# Patient Record
Sex: Male | Born: 1980
Health system: Southern US, Community
[De-identification: ages and names within clinical notes are randomized; demographics above are authoritative.]

## PROBLEM LIST (undated history)

## (undated) HISTORY — PX: KNEE SURGERY: SHX244

---
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
## 2002-10-20 ENCOUNTER — Encounter: Payer: Self-pay | Admitting: *Deleted

## 2002-10-20 ENCOUNTER — Emergency Department (HOSPITAL_COMMUNITY): Admission: EM | Admit: 2002-10-20 | Discharge: 2002-10-20 | Payer: Self-pay | Admitting: Emergency Medicine

## 2002-10-31 ENCOUNTER — Emergency Department (HOSPITAL_COMMUNITY): Admission: EM | Admit: 2002-10-31 | Discharge: 2002-10-31 | Payer: Self-pay | Admitting: Emergency Medicine

## 2002-11-19 ENCOUNTER — Emergency Department (HOSPITAL_COMMUNITY): Admission: EM | Admit: 2002-11-19 | Discharge: 2002-11-20 | Payer: Self-pay | Admitting: Emergency Medicine

## 2007-10-21 ENCOUNTER — Emergency Department (HOSPITAL_COMMUNITY): Admission: EM | Admit: 2007-10-21 | Discharge: 2007-10-22 | Payer: Self-pay | Admitting: Emergency Medicine

## 2009-10-07 ENCOUNTER — Inpatient Hospital Stay (HOSPITAL_COMMUNITY): Admission: EM | Admit: 2009-10-07 | Discharge: 2009-10-08 | Payer: Self-pay | Admitting: Emergency Medicine

## 2010-09-03 LAB — COMPREHENSIVE METABOLIC PANEL
Alkaline Phosphatase: 66 U/L (ref 39–117)
BUN: 13 mg/dL (ref 6–23)
BUN: 16 mg/dL (ref 6–23)
CO2: 26 mEq/L (ref 19–32)
CO2: 27 mEq/L (ref 19–32)
Chloride: 104 mEq/L (ref 96–112)
Creatinine, Ser: 1.45 mg/dL (ref 0.4–1.5)
GFR calc non Af Amer: 54 mL/min — ABNORMAL LOW (ref >60)
GFR calc non Af Amer: 58 mL/min — ABNORMAL LOW (ref >60)
Glucose, Bld: 141 mg/dL — ABNORMAL HIGH (ref 70–99)
Potassium: 3.7 mEq/L (ref 3.5–5.1)
Total Bilirubin: 0.5 mg/dL (ref 0.3–1.2)
Total Bilirubin: 0.5 mg/dL (ref 0.3–1.2)
Total Protein: 6.4 g/dL (ref 6.0–8.3)

## 2010-09-03 LAB — CBC
HCT: 44.1 % (ref 39.0–52.0)
MCHC: 33.3 g/dL (ref 30.0–36.0)
MCV: 90.6 fL (ref 78.0–100.0)
MCV: 90.9 fL (ref 78.0–100.0)
Platelets: 228 10*3/uL (ref 150–400)
Platelets: 239 10*3/uL (ref 150–400)
RBC: 4.87 MIL/uL (ref 4.22–5.81)
RDW: 13.3 % (ref 11.5–15.5)
WBC: 4.1 10*3/uL (ref 4.0–10.5)

## 2010-09-03 LAB — MAGNESIUM: Magnesium: 2.3 mg/dL (ref 1.5–2.5)

## 2010-09-03 LAB — RAPID URINE DRUG SCREEN, HOSP PERFORMED
Amphetamines: NOT DETECTED
Barbiturates: NOT DETECTED
Tetrahydrocannabinol: NOT DETECTED

## 2010-09-03 LAB — DIFFERENTIAL
Basophils Absolute: 0 10*3/uL (ref 0.0–0.1)
Lymphocytes Relative: 42 % (ref 12–46)
Neutro Abs: 1.9 10*3/uL (ref 1.7–7.7)
Neutrophils Relative %: 48 % (ref 43–77)

## 2010-09-03 LAB — BASIC METABOLIC PANEL
BUN: 13 mg/dL (ref 6–23)
CO2: 27 mEq/L (ref 19–32)
Calcium: 8.5 mg/dL (ref 8.4–10.5)
Chloride: 109 mEq/L (ref 96–112)
Creatinine, Ser: 1.4 mg/dL (ref 0.4–1.5)
GFR calc Af Amer: >60 mL/min (ref >60)
Glucose, Bld: 97 mg/dL (ref 70–99)

## 2010-09-03 LAB — PROTEIN, CSF: Total  Protein, CSF: 27 mg/dL (ref 15–45)

## 2010-09-03 LAB — CSF CELL COUNT WITH DIFFERENTIAL
RBC Count, CSF: 1 /mm3 — ABNORMAL HIGH
Tube #: 1
WBC, CSF: 0 /mm3 (ref 0–5)

## 2010-09-03 LAB — URINALYSIS, ROUTINE W REFLEX MICROSCOPIC
Nitrite: NEGATIVE
Protein, ur: NEGATIVE mg/dL
Specific Gravity, Urine: 1.011 (ref 1.005–1.030)
Urobilinogen, UA: 1 mg/dL (ref 0.0–1.0)

## 2010-09-03 LAB — CSF CULTURE W GRAM STAIN
Culture: NO GROWTH
Gram Stain: NONE SEEN

## 2010-09-03 LAB — SEDIMENTATION RATE: Sed Rate: 12 mm/hr (ref 0–16)

## 2010-09-03 LAB — SALICYLATE LEVEL: Salicylate Lvl: <4 mg/dL (ref 2.8–20.0)

## 2010-09-03 LAB — HIV ANTIBODY (ROUTINE TESTING W REFLEX): HIV: NONREACTIVE

## 2010-09-03 LAB — VITAMIN B12: Vitamin B-12: 454 pg/mL (ref 211–911)

## 2014-10-05 ENCOUNTER — Encounter (HOSPITAL_COMMUNITY): Payer: Self-pay | Admitting: Emergency Medicine

## 2014-10-05 ENCOUNTER — Emergency Department (HOSPITAL_COMMUNITY)
Admission: EM | Admit: 2014-10-05 | Discharge: 2014-10-06 | Disposition: A | Discharge status: Home / Self Care | Payer: No Typology Code available for payment source | Attending: Emergency Medicine | Admitting: Emergency Medicine

## 2014-10-05 DIAGNOSIS — S3992XA Unspecified injury of lower back, initial encounter: Secondary | ICD-10-CM | POA: Diagnosis present

## 2014-10-05 DIAGNOSIS — Y92410 Unspecified street and highway as the place of occurrence of the external cause: Secondary | ICD-10-CM | POA: Insufficient documentation

## 2014-10-05 DIAGNOSIS — M545 Low back pain, unspecified: Secondary | ICD-10-CM

## 2014-10-05 DIAGNOSIS — S4991XA Unspecified injury of right shoulder and upper arm, initial encounter: Secondary | ICD-10-CM | POA: Diagnosis not present

## 2014-10-05 DIAGNOSIS — Y9389 Activity, other specified: Secondary | ICD-10-CM | POA: Diagnosis not present

## 2014-10-05 DIAGNOSIS — S6992XA Unspecified injury of left wrist, hand and finger(s), initial encounter: Secondary | ICD-10-CM | POA: Diagnosis not present

## 2014-10-05 DIAGNOSIS — Y998 Other external cause status: Secondary | ICD-10-CM | POA: Insufficient documentation

## 2014-10-05 DIAGNOSIS — G8929 Other chronic pain: Secondary | ICD-10-CM | POA: Insufficient documentation

## 2014-10-05 DIAGNOSIS — M25532 Pain in left wrist: Secondary | ICD-10-CM

## 2014-10-05 DIAGNOSIS — M25511 Pain in right shoulder: Secondary | ICD-10-CM
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
## 2014-10-06 ENCOUNTER — Emergency Department (HOSPITAL_COMMUNITY): Payer: No Typology Code available for payment source

## 2014-10-06 MED ORDER — METHOCARBAMOL 500 MG PO TABS
500.0000 mg | ORAL_TABLET | Freq: Once | ORAL | Status: AC
  Administered 2014-10-06: 500 mg via ORAL
  Filled 2014-10-06: qty 1

## 2014-10-06 MED ORDER — NAPROXEN 500 MG PO TABS: 500.0000 mg | ORAL_TABLET | Freq: Two times a day (BID) | ORAL | Status: DC

## 2014-10-06 MED ORDER — IBUPROFEN 400 MG PO TABS
800.0000 mg | ORAL_TABLET | Freq: Once | ORAL | Status: AC
  Administered 2014-10-06: 800 mg via ORAL
  Filled 2014-10-06: qty 2

## 2014-10-06 MED ORDER — METHOCARBAMOL 500 MG PO TABS: 500.0000 mg | ORAL_TABLET | Freq: Two times a day (BID) | ORAL | Status: DC
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
## 2014-10-06 NOTE — Discharge Instructions (Signed)
1. Medications: robaxin, naproxyn, usual home medications °2. Treatment: rest, drink plenty of fluids, gentle stretching as discussed, alternate ice and heat °3. Follow Up: Please followup with your primary doctor in 3 days for discussion of your diagnoses and further evaluation after today's visit; if you do not have a primary care doctor use the resource guide provided to find one;  Return to the ER for worsening back pain, difficulty walking, loss of bowel or bladder control or other concerning symptoms ° ° °Back Exercises °Back exercises help treat and prevent back injuries. The goal of back exercises is to increase the strength of your abdominal and back muscles and the flexibility of your back. These exercises should be started when you no longer have back pain. Back exercises include: °· Pelvic Tilt. Lie on your back with your knees bent. Tilt your pelvis until the lower part of your back is against the floor. Hold this position 5 to 10 sec and repeat 5 to 10 times. °· Knee to Chest. Pull first 1 knee up against your chest and hold for 20 to 30 seconds, repeat this with the other knee, and then both knees. This may be done with the other leg straight or bent, whichever feels better. °· Sit-Ups or Curl-Ups. Bend your knees 90 degrees. Start with tilting your pelvis, and do a partial, slow sit-up, lifting your trunk only 30 to 45 degrees off the floor. Take at least 2 to 3 seconds for each sit-up. Do not do sit-ups with your knees out straight. If partial sit-ups are difficult, simply do the above but with only tightening your abdominal muscles and holding it as directed. °· Hip-Lift. Lie on your back with your knees flexed 90 degrees. Push down with your feet and shoulders as you raise your hips a couple inches off the floor; hold for 10 seconds, repeat 5 to 10 times. °· Back arches. Lie on your stomach, propping yourself up on bent elbows. Slowly press on your hands, causing an arch in your low back. Repeat 3  to 5 times. Any initial stiffness and discomfort should lessen with repetition over time. °· Shoulder-Lifts. Lie face down with arms beside your body. Keep hips and torso pressed to floor as you slowly lift your head and shoulders off the floor. °Do not overdo your exercises, especially in the beginning. Exercises may cause you some mild back discomfort which lasts for a few minutes; however, if the pain is more severe, or lasts for more than 15 minutes, do not continue exercises until you see your caregiver. Improvement with exercise therapy for back problems is slow.  °See your caregivers for assistance with developing a proper back exercise program. °Document Released: 07/10/2004 Document Revised: 08/25/2011 Document Reviewed: 04/03/2011 °ExitCare® Patient Information ©2015 [employer]. This information is not intended to replace advice given to you by your health care provider. Make sure you discuss any questions you have with your health care provider. ° ° ° °Emergency Department Resource Guide °1) Find a Doctor and Pay Out of Pocket °Although you won't have to find out who is covered by your insurance plan, it is a good idea to ask around and get recommendations. You will then need to call the office and see if the doctor you have chosen will accept you as a new patient and what types of options they offer for patients who are self-pay. Some doctors offer discounts or will set up payment plans for their patients who do not have insurance, but   you will need to ask so you aren't surprised when you get to your appointment. ° °2) Contact Your Local Health Department °Not all health departments have doctors that can see patients for sick visits, but many do, so it is worth a call to see if yours does. If you don't know where your local health department is, you can check in your phone book. The CDC also has a tool to help you locate your state's health department, and many state websites also have listings of all  of their local health departments. ° °3) Find a Walk-in Clinic °If your illness is not likely to be very severe or complicated, you may want to try a walk in clinic. These are popping up all over the country in pharmacies, drugstores, and shopping centers. They're usually staffed by nurse practitioners or physician assistants that have been trained to treat common illnesses and complaints. They're usually fairly quick and inexpensive. However, if you have serious medical issues or chronic medical problems, these are probably not your best option. ° °No Primary Care Doctor: °- Call [facility] at  832-8000 - they can help you locate a primary care doctor that  accepts your insurance, provides certain services, etc. °- Physician Referral Service- 1-800-533-3463 ° °Chronic Pain Problems: °Organization         Address  Phone   Notes  °[facility]  (336) 297-2271 Patients need to be referred by their primary care doctor.  ° °Medication Assistance: °Organization         Address  Phone   Notes  °[place] [facility] [address] °[place], [place] [address] (336) 641-8030 --Must be a resident of [place] °-- Must have NO insurance coverage whatsoever (no Medicaid/ Medicare, etc.) °-- The pt. MUST have a primary care doctor that directs their care regularly and follows them in the community °  °[facility]  (866) 331-1348   °[employer]  (888) 892-1162   ° °Agencies that provide inexpensive medical care: °Organization         Address  Phone   Notes  °[facility]  (336) 832-8035   °[facility]    (336) 832-7272   °[facility] [address] °[place], [place] [address] (336) 832-4777   °[facility] [address], °[place] (336) 271-4999   °[facility]    (336) 373-0678   °[facility]    (336) 272-1050   °[facility] ° [address],  [place] Phone:  (336) 832-4444, Fax:  (336) 832-4440 Hours of Operation:  9 am - 6 pm, M-F.  Also accepts Medicaid/Medicare and self-pay.  °[facility] ° [address], [place] Phone: (336) 832-3150, Fax: (336) [phone]. Hours of Operation:  8:30 am - 5:30 pm, M-F.  Also accepts Medicaid and self-pay.  °[facility] [place] [address], [place] Phone: (336) 878-6027   °[facility] [address], [place], [place] (336)723-1848, Ext. 123 Mondays & Thursdays: 7-9 AM.  First 15 patients are seen on a first come, first serve basis. °  ° °Medicaid-accepting [place] Providers: ° °Organization         Address  Phone   Notes  °[facility] [address], [place] (336) 641-2100 Also accepts self-pay patients.  °[facility] [address], [place] ° (336) 856-9996   °[facility] [address], [place] (336) 288-8857   °[facility] [address] [place] [address], [place] (336) 299-7000   °Veita Bland [address], [place]  ° (336) 373-1557 Only accepts [place] Access Medicaid patients after they have their name applied to their card.  ° °Self-Pay (no insurance) in [place]: ° °Organization         Address  Phone   Notes  °Sickle Cell Patients, [facility] [address], [place] (336) 832-1970   °[facility] [address], [place] (336) 832-4400   °[facility] ° [address],  (336) 992-4800   °[facility]/Dr. Osei-Bonsu ° [address] [place] [address], [place] or [address], [place] (336) [phone] Phone number for both [place] and [place] locations is the same.  °[facility] [address], [place] (336) 299-0000   °[facility] [address] [place] [address], [place] or [address] (336) 852-7530 °(336) 878-2260   °[facility] [address], [place] (336) 350-1642, phone; (336) 294-5005, fax Sees patients 1st and 3rd Saturday of every month.  Must not qualify for public or private insurance (i.e. Medicaid, Medicare, [place] Health Choice, Veterans' Benefits) • Household income should be no more than 200% of the poverty level •The clinic cannot treat you if you are pregnant or think you are pregnant • Sexually transmitted diseases are not treated at the clinic.  ° ° °Dental Care: °Organization         Address  Phone  Notes  °[facility] [address], [place] (336) 641-6152 Accepts children up to age 21 who are enrolled in Medicaid or [place] Health Choice; pregnant women with a Medicaid card; and children who have applied for Medicaid or [place] Health Choice, but were declined, whose parents can pay a reduced fee at time of service.  °[facility] [place]  [address], [place] (336) 641-7733 Accepts children up to age 21 who are enrolled in Medicaid or [place] Health Choice; pregnant women with a Medicaid card; and children who have applied for Medicaid or [place] Health Choice, but were declined, whose parents can pay a reduced fee at time of service.  °[facility] ° [address], [place] (336) 641-4533 Patients are seen by appointment only. Walk-ins are not accepted. [facility] will see patients 18 years of age and older. °Monday - Tuesday (8am-5pm) °Most Wednesdays (8:30-5pm) °$30 per visit, cash only  °[facility] ° [address], [place] (336) 641-4533 Patients are seen by appointment only. Walk-ins are not accepted. [facility] will see patients 18 years of age and older. °One Wednesday Evening (Monthly: Volunteer Based).  $30 per visit, cash only  °[facility]   (919) 537-3737 for adults; Children under age 4, call Graduate Pediatric Dentistry at (919) [phone]. Children aged 4-14, please call (919) [phone] to request a pediatric application. ° Dental services are provided in all areas of dental care including fillings, crowns and bridges, complete and partial dentures, implants, gum treatment, root canals, and extractions. Preventive care is also provided. Treatment is provided to both adults and children. °Patients are selected via a lottery   and there is often a waiting list. °  °[facility] [address], °[place] ° (336) 763-8833 [email or web address] °  °[facility] [address], [place], [place] (336)723-1848, Ext. 123 Second and Fourth Thursday of each month, opens at 6:30 AM; Clinic ends at 9 AM.  Patients are seen on a first-come first-served basis, and a limited number are seen during each clinic.  ° °[facility] ° [address], [place], [place] (336) 723-7904   Eligibility Requirements °You must have lived in [place], or [place] counties for at least the last three months. °  You cannot be eligible for state or federal sponsored healthcare insurance, including Veterans Administration, Medicaid, or Medicare. °  You generally cannot be eligible for healthcare insurance through your employer.  °  How to apply: °Eligibility screenings are held every Tuesday and Wednesday afternoon from 1:00 pm until 4:00 pm. You do not need an appointment for the interview!  °[facility] [address], [place], [place] 336-631-2330   °[facility]  336-342-8273   °[facility]  336-703-3100   °[facility]  336-570-6415   ° °Behavioral Health Resources in the Community: °Intensive Outpatient Programs °Organization         Address  Phone  Notes  °[facility] [address], [place], [place] 336-878-6098   °[facility] [address], [place], [place] 336-832-9800   °[facility] [address], [place], [place] ° 336-882-2125   °[facility] [address],  °[place], [place] 1-800-853-5163 or 336-641-4981   °Substance Abuse Resources °Organization         Address  Phone  Notes  °[facility]  336-882-2125   °[facility]  336-784-9470   °[facility]  336-285-9073   °[facility]  336-845-3988   °Residential & Outpatient Substance Abuse Program  1-800-659-3381   °Psychological Services °Organization         Address  Phone  Notes  °[facility]  336- 832-9600   °[facility]  336- 378-7881   °[facility] [address], [place] 1-800-853-5163 or 336-641-4981   ° °Mobile Crisis Teams °Organization         Address  Phone  Notes  °[facility], Mobile Crisis Care Unit  1-877-626-1772   °Assertive °[facility] ° [address] [place], [place] 336-834-9664   °Sharon DeEsch [address] °[place] 336-554-5454   ° °Self-Help/Support Groups °Organization         Address  Phone             Notes  °[facility] - variety of support groups  336- 373-1402 Call for more information  °Narcotics Anonymous (NA), [facility] [address], °[place]  2 meetings at this location  ° °Residential Treatment Programs °Organization         Address  Phone  Notes  °[facility] [address],    °[place]  1-866-801-8205   °[facility] ° [address], [place], [place] 704-293-8524   °[facility] [address], [place] 336-845-3988 Admissions: 8am-3pm M-F  °[facility] [address]    °[place], [place] 336-841-1104   °[facility] [address], [place], [place]   336-379-7146   °[facility] [address]  °[place], [place] 336-285-9073     °[facility] - Intensive Outpatient [address], [place], [place] 336-852-3033   °[facility] [address]  °[place], [place] 1-877-615-2722 or 336-784-9470   °[facility] [address] [place], [place] 336-227-7417 Accepts Medicaid  °[facility] [address]  °[place] 1-800-659-3381 Substance Abuse/Addiction Treatment  ° °[place] Behavioral Health Resources °Organization         Address  Phone  Notes  °[facility]  (888) 581-9988   °Julie Brannon, PhD [address] [place], [place]   (336) 349-5553 or (336) 951-0000   °[facility]   [address] °[place], [place] (336) 349-4454   °[facility] [address], [place], [place] (336) 342-8316 Insurance/Medicaid/sponsorship through [facility]  °[facility] [address]                                    [place], [place] (336) 342-8316 Therapy/tele-psych/case  °[facility] [address]  ° [place], [place] (336) 349-2233    °Dr. Arfeen  (336) 349-4544   °[facility] [place]  [employer] [place] [facility] 1) [address],  °2) [address], [place] °3)  [address], [place] (336) 349-3220 °(336) 342-7768 ° °(336) 342-8140   °[place] [facility] (336) 342-1394 or (336) 342-3537 (After Hours)    ° ° ° ° ° °

## 2017-09-08 ENCOUNTER — Ambulatory Visit (HOSPITAL_COMMUNITY)
Admission: EM | Admit: 2017-09-08 | Discharge: 2017-09-08 | Disposition: A | Discharge status: Home / Self Care | Payer: Self-pay | Attending: Family Medicine | Admitting: Family Medicine

## 2017-09-08 ENCOUNTER — Encounter (HOSPITAL_COMMUNITY): Payer: Self-pay | Admitting: Family Medicine

## 2017-09-08 ENCOUNTER — Ambulatory Visit (INDEPENDENT_AMBULATORY_CARE_PROVIDER_SITE_OTHER): Payer: Self-pay

## 2017-09-08 DIAGNOSIS — W208XXA Other cause of strike by thrown, projected or falling object, initial encounter: Secondary | ICD-10-CM

## 2017-09-08 DIAGNOSIS — S99921A Unspecified injury of right foot, initial encounter: Secondary | ICD-10-CM

## 2017-09-08 DIAGNOSIS — S91212A Laceration without foreign body of left great toe with damage to nail, initial encounter: Secondary | ICD-10-CM

## 2017-09-08 MED ORDER — HYDROCODONE-ACETAMINOPHEN 5-325 MG PO TABS: 1.0000 | ORAL_TABLET | Freq: Four times a day (QID) | ORAL | 0 refills | Status: AC | PRN

## 2017-09-08 NOTE — ED Triage Notes (Addendum)
Pt here for right great toe injury. sts that he dropped a large speaker on it. He has cleaned it and wrapped it. Pt wants to wait on xray to see the doctor based on cost and insurance.

## 2017-09-08 NOTE — ED Provider Notes (Signed)
[facility]    CSN: [id number] Arrival date & time: 09/08/17  1607     History   Chief Complaint Chief Complaint  Patient presents with  . Toe Injury    HPI Tony Briggs is a 37 y.o. male presenting today with toe injury.  Last night around 9:00 he dropped a speaker that landed on his toe.  He was wearing shoes, but were not still toe shoes.  Since he has had pain and numbness to his toes.  He cleaned his toe last night, and wrapped it up with a nonadherent dressing.  He attempted to go to work today, but was unable to put much weight on toe.  HPI  History reviewed. No pertinent past medical history.  There are no active problems to display for this patient.   History reviewed. No pertinent surgical history.     Home Medications    Prior to Admission medications   Medication Sig Start Date End Date Taking? Authorizing Provider  HYDROcodone-acetaminophen (NORCO/VICODIN) 5-325 MG tablet Take 1 tablet by mouth every 6 (six) hours as needed for up to 3 days. 09/08/17 09/11/17  Wieters, [name] C, PA-C  methocarbamol (ROBAXIN) 500 MG tablet Take 1 tablet (500 mg total) by mouth 2 (two) times daily. 10/06/14   Muthersbaugh, Dahlia Client, PA-C  naproxen (NAPROSYN) 500 MG tablet Take 1 tablet (500 mg total) by mouth 2 (two) times daily with a meal. 10/06/14   Muthersbaugh, Boyd Kerbs    Family History History reviewed. No pertinent family history.  Social History Social History   Tobacco Use  . Smoking status: Never Smoker  Substance Use Topics  . Alcohol use: No  . Drug use: No     Allergies   Patient has no known allergies.   Review of Systems Review of Systems  Constitutional: Negative for fatigue and fever.  Gastrointestinal: Negative for nausea and vomiting.  Musculoskeletal: Positive for arthralgias, gait problem and myalgias.  Skin: Positive for color change and wound.  Neurological: Positive for numbness. Negative for dizziness, weakness,  light-headedness and headaches.     Physical Exam Triage Vital Signs ED Triage Vitals  Enc Vitals Group     BP 09/08/17 1647 127/83     Pulse Rate 09/08/17 1647 91     Resp 09/08/17 1647 18     Temp 09/08/17 1647 98.6 F (37 C)     Temp src --      SpO2 09/08/17 1647 98 %     Weight --      Height --      Head Circumference --      Peak Flow --      Pain Score 09/08/17 1646 7     Pain Loc --      Pain Edu? --      Excl. in GC? --    No data found.  Updated Vital Signs BP 127/83   Pulse 91   Temp 98.6 F (37 C)   Resp 18   SpO2 98%   Visual Acuity Right Eye Distance:   Left Eye Distance:   Bilateral Distance:    Right Eye Near:   Left Eye Near:    Bilateral Near:     Physical Exam  Constitutional: He appears well-developed and well-nourished.  HENT:  Head: Normocephalic and atraumatic.  Eyes: Conjunctivae are normal.  Neck: Neck supple.  Cardiovascular: Normal rate.  Pulmonary/Chest: Effort normal. No respiratory distress.  Musculoskeletal: He exhibits no edema.  Tenderness to  palpation of phalanx and around nail  Neurological: He is alert.  Skin: Skin is warm and dry.  Right great toe: Small area of subcutaneous fat sticking out from medial aspect of to just superior to nail.  Area of exposed nail below cuticle her skin appears to have come off extending up to lateral nail fold  Psychiatric: He has a normal mood and affect.  Nursing note and vitals reviewed.      UC Treatments / Results  Labs (all labs ordered are listed, but only abnormal results are displayed) Labs Reviewed - No data to display  EKG None Radiology Dg Toe Great Right  Result Date: 09/08/2017 CLINICAL DATA:  Injury EXAM: RIGHT GREAT TOE COMPARISON:  None. FINDINGS: No definite acute displaced fracture or malalignment. Amorphous opacities projecting over the soft tissues of the distal first digit, deep to the nail bed. IMPRESSION: 1. No definite acute osseous abnormality 2.  Amorphous opacity within the soft tissues of the distal first digit, in or deep to the nail bed, possible foreign material Electronically Signed   By: Jasmine PangKim  Fujinaga M.D.   On: 09/08/2017 18:13    Procedures Procedures (including critical care time)  Medications Ordered in UC Medications - No data to display   Initial Impression / Assessment and Plan / UC Course  I have reviewed the triage vital signs and the nursing notes.  Pertinent labs & imaging results that were available during my care of the patient were reviewed by me and considered in my medical decision making (see chart for details).     Toe with significant trauma.  No fracture observed on x-ray.  Did show opacity to distal toe, based off exam do not feel foreign body is likely.  Will monitor.  Patient has laceration to lateral aspect around nail, does not seem to pull together as well as thin intermediate tissue, do not feel suturing or repairing is possible based off movement of tissues on exam.  Advised patient to clean with warm soapy water daily.  Monitor for signs of infection. Discussed strict return precautions. Patient verbalized understanding and is agreeable with plan.   Final Clinical Impressions(s) / UC Diagnoses   Final diagnoses:  Toe injury, right, initial encounter    ED Discharge Orders        Ordered    HYDROcodone-acetaminophen (NORCO/VICODIN) 5-325 MG tablet  Every 6 hours PRN     09/08/17 1810       Controlled Substance Prescriptions [place] Controlled Substance Registry consulted? Yes, I have consulted the [place] Controlled Substances Registry for this patient, and feel the risk/benefit ratio today is favorable for proceeding with this prescription for a controlled substance.   Lew DawesWieters, [name] C, [place] JerseyPA-C 09/08/17 2128

## 2017-09-08 NOTE — ED Notes (Signed)
Patient asked for a shoe, hallie, pa agreed.

## 2017-09-08 NOTE — Discharge Instructions (Addendum)
Use anti-inflammatories for pain/swelling. You may take up to 800 mg Ibuprofen every 8 hours with food. You may supplement Ibuprofen with Tylenol [phone] mg every 8 hours.   May use hydrocodone at night or when at home, no driving after use  Please clean to multiple times a day with warm soapy water.  Please dry well after washing.  Apply nonadherent dressing as you have been doing.  Rest and elevate when at home.  May apply ice.  Weight-bear as tolerating.

## 2020-07-16 ENCOUNTER — Encounter: Payer: Self-pay | Admitting: Family Medicine

## 2020-07-16 ENCOUNTER — Other Ambulatory Visit: Payer: Self-pay

## 2020-07-16 ENCOUNTER — Ambulatory Visit (INDEPENDENT_AMBULATORY_CARE_PROVIDER_SITE_OTHER): Payer: BC Managed Care – PPO | Admitting: Family Medicine

## 2020-07-16 VITALS — BP 114/72 | HR 87 | Temp 97.3°F | Ht 64.0 in | Wt 126.8 lb

## 2020-07-16 DIAGNOSIS — Z13220 Encounter for screening for lipoid disorders: Secondary | ICD-10-CM

## 2020-07-16 DIAGNOSIS — X503XXA Overexertion from repetitive movements, initial encounter: Secondary | ICD-10-CM | POA: Diagnosis not present

## 2020-07-16 DIAGNOSIS — E785 Hyperlipidemia, unspecified: Secondary | ICD-10-CM | POA: Insufficient documentation

## 2020-07-16 DIAGNOSIS — Z113 Encounter for screening for infections with a predominantly sexual mode of transmission: Secondary | ICD-10-CM

## 2020-07-16 DIAGNOSIS — Z131 Encounter for screening for diabetes mellitus: Secondary | ICD-10-CM | POA: Diagnosis not present

## 2020-07-16 LAB — LIPID PANEL
Cholesterol: 220 mg/dL — ABNORMAL HIGH (ref 0–200)
HDL: 55.3 mg/dL (ref >39.00)
LDL Cholesterol: 148 mg/dL — ABNORMAL HIGH (ref 0–99)
NonHDL: 165.07
Total CHOL/HDL Ratio: 4
Triglycerides: 85 mg/dL (ref 0.0–149.0)
VLDL: 17 mg/dL (ref 0.0–40.0)

## 2020-07-16 LAB — HEMOGLOBIN A1C: Hgb A1c MFr Bld: 5.6 % (ref 4.6–6.5)

## 2020-07-16 NOTE — Progress Notes (Signed)
  [facility] [address] [place] [address] Dept: [phone] Dept Fax: [phone]  New Patient Office Visit  Subjective:    Patient ID: Tony Briggs, male    DOB: 03/12/1981, 40 y.o..   MRN: [id number]   Chief Complaint  Patient presents with  . Establish Care    New patient, no concerns.     History of Present Illness:  Patient is in today to establish care. He notes good overall health. He states he was at the health department recently for STD screening. He denies any current symptoms. He is heterosexual. He is not monogamous. He uses condoms with most encounters, but not always. He notes that periodically, he likes to be checked to be sure he has not contracted an STD.  Mr. Delucia notes right knee surgery as a child related to a growth plate issue. He has occasional bilateral knee discomfort and notes a chronic issue since birth with ROM in the upper extremities. Mr. Michalec is 5\' 4"  tall and notes that most family members are also short.  Past Medical History: There are no problems to display for this patient.   Past Surgical History:  Procedure Laterality Date  . KNEE SURGERY Right     Family History  Problem Relation Age of Onset  . Healthy Mother     Outpatient Medications Prior to Visit  Medication Sig Dispense Refill  . methocarbamol (ROBAXIN) 500 MG tablet Take 1 tablet (500 mg total) by mouth 2 (two) times daily. (Patient not taking: Reported on 07/16/2020) 20 tablet 0  . naproxen (NAPROSYN) 500 MG tablet Take 1 tablet (500 mg total) by mouth 2 (two) times daily with a meal. (Patient not taking: Reported on 07/16/2020) 30 tablet 0   No facility-administered medications prior to visit.   No Known Allergies    Objective:   Today's Vitals   07/16/20 1312  BP: 114/72  Pulse: 87  Temp: (!) 97.3 F (36.3 C)  TempSrc: Temporal  SpO2: 96%  Weight: 126 lb 12.8 oz (57.5 kg)  Height: 5\' 4"   (1.626 m)   Body mass index is 21.77 kg/m.   General: Well developed, well nourished. No acute distress. Extremities: ROM of knees appears normal. No joint swelling or tenderness. No edema noted. Psych: Alert and oriented. Normal mood and affect.  Health Maintenance Due  Topic Date Due  . Hepatitis C Screening  Never done  . TETANUS/TDAP  Never done     Assessment & Plan:   1. Screening for STD (sexually transmitted disease) Will conduct screening today to assure we have current results.  - HCV Ab w Reflex to Quant PCR - Hepatitis B surface antigen - HIV Antibody (routine testing w rflx) - GC/Chlamydia Probe Amp  2. Screening for diabetes mellitus  - Hemoglobin A1c  3. Screening for cholesterol level  - Lipid panel  4. Overuse syndrome  Orthopedic issues appear to be mainly temporary overuse issues. We discussed rest, ice vs. heat with ROM, and OTC ibuprofen when bothersome.   07/18/20, MD

## 2020-07-17 LAB — HEPATITIS B SURFACE ANTIGEN: Hepatitis B Surface Ag: NONREACTIVE

## 2020-07-17 LAB — HCV AB W REFLEX TO QUANT PCR: HCV Ab: <0.1 s/co ratio (ref 0.0–0.9)

## 2020-07-17 LAB — HCV INTERPRETATION

## 2020-07-17 LAB — HIV ANTIBODY (ROUTINE TESTING W REFLEX): HIV 1&2 Ab, 4th Generation: NONREACTIVE

## 2020-08-22 ENCOUNTER — Other Ambulatory Visit: Payer: Self-pay

## 2020-08-22 ENCOUNTER — Encounter: Payer: Self-pay | Admitting: Family Medicine

## 2020-08-22 ENCOUNTER — Ambulatory Visit (INDEPENDENT_AMBULATORY_CARE_PROVIDER_SITE_OTHER): Payer: BC Managed Care – PPO

## 2020-08-22 ENCOUNTER — Ambulatory Visit (INDEPENDENT_AMBULATORY_CARE_PROVIDER_SITE_OTHER): Payer: BC Managed Care – PPO | Admitting: Family Medicine

## 2020-08-22 VITALS — BP 112/70 | HR 88 | Temp 97.8°F | Ht 64.0 in | Wt 129.6 lb

## 2020-08-22 DIAGNOSIS — M2392 Unspecified internal derangement of left knee: Secondary | ICD-10-CM

## 2020-08-22 DIAGNOSIS — M25551 Pain in right hip: Secondary | ICD-10-CM

## 2020-08-22 DIAGNOSIS — Z113 Encounter for screening for infections with a predominantly sexual mode of transmission: Secondary | ICD-10-CM

## 2020-08-22 DIAGNOSIS — M25462 Effusion, left knee: Secondary | ICD-10-CM | POA: Diagnosis not present

## 2020-08-22 DIAGNOSIS — M1611 Unilateral primary osteoarthritis, right hip: Secondary | ICD-10-CM | POA: Diagnosis not present

## 2020-08-22 DIAGNOSIS — S8992XA Unspecified injury of left lower leg, initial encounter: Secondary | ICD-10-CM | POA: Diagnosis not present

## 2020-08-22 DIAGNOSIS — Q6589 Other specified congenital deformities of hip: Secondary | ICD-10-CM | POA: Diagnosis not present
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
## 2020-08-23 ENCOUNTER — Other Ambulatory Visit (HOSPITAL_COMMUNITY)
Admission: RE | Admit: 2020-08-23 | Discharge: 2020-08-23 | Disposition: A | Discharge status: Home / Self Care | Payer: BC Managed Care – PPO | Source: Ambulatory Visit | Attending: Family Medicine | Admitting: Family Medicine

## 2020-08-23 DIAGNOSIS — Z113 Encounter for screening for infections with a predominantly sexual mode of transmission: Secondary | ICD-10-CM | POA: Insufficient documentation

## 2020-08-23 NOTE — Addendum Note (Signed)
Addended by: Varney Biles on: 08/23/2020 07:45 AM   Modules accepted: Orders

## 2020-08-24 LAB — URINE CYTOLOGY ANCILLARY ONLY
Chlamydia: NEGATIVE
Comment: NEGATIVE
Comment: NORMAL
Neisseria Gonorrhea: NEGATIVE

## 2020-08-27 ENCOUNTER — Ambulatory Visit (INDEPENDENT_AMBULATORY_CARE_PROVIDER_SITE_OTHER): Payer: BC Managed Care – PPO | Admitting: Orthopedic Surgery

## 2020-08-27 ENCOUNTER — Other Ambulatory Visit: Payer: Self-pay

## 2020-08-27 DIAGNOSIS — M87059 Idiopathic aseptic necrosis of unspecified femur: Secondary | ICD-10-CM | POA: Diagnosis not present

## 2020-08-27 DIAGNOSIS — M1712 Unilateral primary osteoarthritis, left knee: Secondary | ICD-10-CM

## 2020-08-30 ENCOUNTER — Encounter: Payer: Self-pay | Admitting: Orthopedic Surgery

## 2020-08-30 ENCOUNTER — Encounter: Payer: Self-pay | Admitting: Family Medicine

## 2020-08-30 DIAGNOSIS — M1712 Unilateral primary osteoarthritis, left knee: Secondary | ICD-10-CM | POA: Diagnosis not present

## 2020-08-30 DIAGNOSIS — M87051 Idiopathic aseptic necrosis of right femur: Secondary | ICD-10-CM | POA: Insufficient documentation

## 2020-08-30 DIAGNOSIS — Q6589 Other specified congenital deformities of hip: Secondary | ICD-10-CM | POA: Insufficient documentation

## 2020-08-30 MED ORDER — LIDOCAINE HCL (PF) 1 % IJ SOLN
5.0000 mL | INTRAMUSCULAR | Status: AC | PRN
  Administered 2020-08-30: 5 mL

## 2020-08-30 MED ORDER — METHYLPREDNISOLONE ACETATE 40 MG/ML IJ SUSP
40.0000 mg | INTRAMUSCULAR | Status: AC | PRN
  Administered 2020-08-30: 40 mg via INTRA_ARTICULAR
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
## 2020-08-30 NOTE — Addendum Note (Signed)
Addended by: Aldean Baker on: 08/30/2020 08:03 AM   Modules accepted: Level of Service

## 2020-09-06 ENCOUNTER — Ambulatory Visit: Payer: BC Managed Care – PPO | Admitting: Orthopedic Surgery

## 2020-09-11 ENCOUNTER — Encounter: Payer: Self-pay | Admitting: Family Medicine

## 2020-09-11 ENCOUNTER — Ambulatory Visit (INDEPENDENT_AMBULATORY_CARE_PROVIDER_SITE_OTHER): Payer: BC Managed Care – PPO | Admitting: Orthopaedic Surgery

## 2020-09-11 ENCOUNTER — Encounter: Payer: Self-pay | Admitting: Orthopaedic Surgery

## 2020-09-11 DIAGNOSIS — M162 Bilateral osteoarthritis resulting from hip dysplasia: Secondary | ICD-10-CM

## 2020-09-11 NOTE — Progress Notes (Signed)
Office Visit Note   Patient: Tony Briggs           Date of Birth: 17-Aug-1980           MRN: [id number] Visit Date: 09/11/2020              Requested by: Loyola Mast, MD [address] [place],  [place] [address] PCP: Loyola Mast, MD   Assessment & Plan: Visit Diagnoses:  1. 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 Magnus Ivan does not feel comfortable proceeding with total hip arthroplasty.  Therefore we will send him to Dr. Doristine Counter to see if e would be candidate for anterior hip replacement or if he would require a different approach due to his severe arthritic changes.  Questions were encouraged and answered by Dr. Magnus Ivan and myself at length.  We will make this referral for him.     Follow-Up Instructions: Return if symptoms worsen or fail to improve.   Orders:  No orders of the defined types were placed in this encounter.  No orders of the defined types were placed in this encounter.     Procedures: No procedures performed   Clinical Data: No additional findings.   Subjective: Chief Complaint  Patient presents with  . Left Hip - Pain  . Right Hip - Pain    HPI Patient is a 40 year old male comes in today referred from Dr. Lajoyce Corners due to bilateral hip advanced osteoarthritis and possible avascular necrosis.  Patient states he has had pain in both hips for all of his life.  He had difficulty with running but continued to play sports during high school.  Said pain is becoming worse.  Currently his right hip is more bothersome than the left.  He uses no assistive device.  He is began taking some ibuprofen due to the pain which does help some.  He has had no known injury to either hip.  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  He will occasionally drink a shot of alcohol but this is only 2 or 3 times a month.  Does  have borderline hyperlipidemia.  He is otherwise healthy. Radiographs AP pelvis lateral view of both hips dated 08/24/2020 are reviewed.  No acute fracture.  He has severe bilateral degenerative changes both hips with congenital dysplastic changes.  Femoral heads are flattened bilaterally.  Review of Systems See HPI otherwise negative  Objective: Vital Signs: There were no vitals taken for this visit.  Physical Exam Constitutional:      Appearance: He is normal weight. He is not ill-appearing or diaphoretic.  Pulmonary:     Effort: Pulmonary effort is normal.  Neurological:     Mental Status: He is alert and oriented to person, place, and time.  Psychiatric:        Mood and Affect: Mood normal.     Ortho Exam Bilateral hips he has pain with external rotation of both hips.  No internal rotation of either hip.  He walks with an antalgic gait without any assistive device. Specialty Comments:  No specialty comments available.  Imaging: No results found.   PMFS History: Patient Active Problem List   Diagnosis Date Noted  . Avascular necrosis of bones of both hips (HCC) 08/30/2020  . Borderline hyperlipidemia 07/16/2020   No past medical history on  file.  Family History  Problem Relation Age of Onset  . Healthy Mother     Past Surgical History:  Procedure Laterality Date  . KNEE SURGERY Right    Social History   Occupational History  . Occupation: Janitorial    Comment: [employer]  Tobacco Use  . Smoking status: Never Smoker  . Smokeless tobacco: Never Used  Vaping Use  . Vaping Use: Never used  Substance and Sexual Activity  . Alcohol use: Yes    Comment: all on occ  . Drug use: No  . Sexual activity: Yes    Birth control/protection: Condom    Comment: Not consistent

## 2020-09-12 ENCOUNTER — Other Ambulatory Visit: Payer: Self-pay

## 2020-09-12 DIAGNOSIS — M162 Bilateral osteoarthritis resulting from hip dysplasia: Secondary | ICD-10-CM

## 2020-09-12 DIAGNOSIS — M87059 Idiopathic aseptic necrosis of unspecified femur: Secondary | ICD-10-CM

## 2020-10-23 ENCOUNTER — Encounter: Payer: Self-pay | Admitting: Orthopaedic Surgery

## 2021-04-08 ENCOUNTER — Ambulatory Visit (INDEPENDENT_AMBULATORY_CARE_PROVIDER_SITE_OTHER): Payer: BC Managed Care – PPO | Admitting: Family Medicine

## 2021-04-08 ENCOUNTER — Encounter: Payer: Self-pay | Admitting: Family Medicine

## 2021-04-08 ENCOUNTER — Other Ambulatory Visit: Payer: Self-pay

## 2021-04-08 VITALS — BP 114/68 | HR 84 | Temp 97.8°F | Ht 64.0 in | Wt 130.6 lb

## 2021-04-08 DIAGNOSIS — Q6589 Other specified congenital deformities of hip: Secondary | ICD-10-CM | POA: Diagnosis not present

## 2021-04-08 DIAGNOSIS — Z113 Encounter for screening for infections with a predominantly sexual mode of transmission: Secondary | ICD-10-CM | POA: Diagnosis not present

## 2021-04-08 DIAGNOSIS — M1712 Unilateral primary osteoarthritis, left knee: Secondary | ICD-10-CM

## 2021-04-08 NOTE — Progress Notes (Signed)
[facility] [address] [place] [address] Dept: [phone] Dept Fax: [phone]  Office Visit  Subjective:    Patient ID: Tony Briggs, male    DOB: [birth year], 40 y.o..   MRN: [id number]  Chief Complaint  Patient presents with   Acute Visit    C/o having LT knee pain x 3 months. He has been using Ice and Ibuprofen with little relief.   Declines flu shot.     History of Present Illness:  Patient is in today for evaluation of his left knee. He notes that for the past 2 weeks the knee has been swollen. He was seen for a similar presentation in March. He was referred to Dr. Lajoyce Briggs (orthopedics) who aspirated his knee joint and instilled a steroid. He notes that the knee had improved until recently. He has not had any new injury.  At the same prior appointment, I had evaluated him for bilateral hip issues. His x-rays showed advanced bilateral hip osteoarthritis, possibly due to avascular necrosis or dysplasia. He was seen initially by Dr. Lajoyce Briggs and then referred to Dr. Magnus Briggs for a 2nd opinion. It appears that he was beign referred to a third physician (Dr. Autumn Briggs with [facility] in [place]). Tony Briggs notes no one ever contacted him back about this.  Tony Briggs also requests STD screening. He is with a single partner. He notes they have been using condoms. However, they are reaching a point in their relationship where they want ot stop condom use. He wants to be checked prior to this.  Past Medical History: Patient Active Problem List   Diagnosis Date Noted   Arthritis of left knee 04/08/2021   Congenital dysplasia of both hips 08/30/2020   Borderline hyperlipidemia 07/16/2020   Past Surgical History:  Procedure Laterality Date   KNEE SURGERY Right    Family History  Problem Relation Age of Onset   Healthy Mother    No outpatient medications prior to visit.   No facility-administered medications  prior to visit.   No Known Allergies    Objective:   Today's Vitals   04/08/21 1331  BP: 114/68  Pulse: 84  Temp: 97.8 F (36.6 C)  TempSrc: Temporal  SpO2: 98%  Weight: 130 lb 9.6 oz (59.2 kg)  Height: 5\' 4"  (1.626 m)   Body mass index is 22.42 kg/m.   General: Well developed, well nourished. No acute distress. Extremities: Mild swelling to the left knee with increased warmth. Skin: Warm and dry. No rashes. Neuro:CN II-XII intact. Normal sensation and DTR bilaterally. Psych: Alert and oriented x3. Normal mood and affect.  Health Maintenance Due  Topic Date Due   COVID-19 Vaccine (1) Never done   TETANUS/TDAP  Never done   INFLUENZA VACCINE  Never done     Assessment & Plan:   1. Arthritis of left knee Reviewed prior imaging studies and consult note from Dr. . I recommend we refer Tony Briggs back for consideration of a repeat intraarticular steroid injection or other therapy.  - Ambulatory referral to Orthopedic Surgery  2. Congenital dysplasia of both hips Reviewed prior imaging studies and consult note from Dr. Lajoyce Briggs and Dr. Lajoyce Briggs. I will refer him back to orthopedics to see about moving on to next steps in potential surgery for his hips.  - Ambulatory referral to Orthopedic Surgery  3. Screening for STD (sexually transmitted disease)  - Hepatitis B surface antigen - Chlamydia/GC NAA, Confirmation - HIV Antibody (routine testing  w rflx) - RPR  Loyola Mast, MD

## 2021-04-08 NOTE — Patient Instructions (Signed)
Chronic Knee Pain, Adult °Chronic knee pain is pain in one or both knees that lasts longer than 3 months. Symptoms of chronic knee pain may include swelling, stiffness, and discomfort. Age-related wear and tear (osteoarthritis) of the knee joint is the most common cause of chronic knee pain. Other possible causes include: °A long-term immune-related disease that causes inflammation of the knee (rheumatoid arthritis). This usually affects both knees. °Inflammatory arthritis, such as gout or pseudogout. °An injury to the knee that causes arthritis. °An injury to the knee that damages the ligaments. Ligaments are strong tissues that connect bones to each other. °Runner's knee or pain behind the kneecap. °Treatment for chronic knee pain depends on the cause. The main treatments for chronic knee pain are physical therapy and weight loss. This condition may also be treated with medicines, injections, a knee sleeve or brace, and by using crutches. Rest, ice, pressure (compression), and elevation, also known as RICE therapy, may also be recommended. °Follow these instructions at home: °If you have a knee sleeve or brace: ° °Wear the knee sleeve or brace as told by your health care provider. Remove it only as told by your health care provider. °Loosen it if your toes tingle, become numb, or turn cold and blue. °Keep it clean. °If the sleeve or brace is not waterproof: °Do not let it get wet. °Remove it if allowed by your health care provider, or cover it with a watertight covering when you take a bath or a shower. °Managing pain, stiffness, and swelling °  °If directed, apply heat to the affected area as often as told by your health care provider. Use the heat source that your health care provider recommends, such as a moist heat pack or a heating pad. °If you have a removable knee sleeve or brace, remove it as told by your health care provider. °Place a towel between your skin and the heat source. °Leave the heat on for  20-30 minutes. °Remove the heat if your skin turns bright red. This is especially important if you are unable to feel pain, heat, or cold. You may have a greater risk of getting burned. °If directed, put ice on the affected area. To do this: °If you have a removable knee sleeve or brace, remove it as told by your health care provider. °Put ice in a plastic bag. °Place a towel between your skin and the bag. °Leave the ice on for 20 minutes, 2-3 times a day. °Remove the ice if your skin turns bright red. This is very important. If you cannot feel pain, heat, or cold, you have a greater risk of damage to the area. °Move your toes often to reduce stiffness and swelling. °Raise (elevate) the injured area above the level of your heart while you are sitting or lying down. °Activity °Avoid high-impact activities or exercises, such as running, jumping rope, or doing jumping jacks. °Follow the exercise plan that your health care provider designed for you. Your health care provider may suggest that you: °Avoid activities that make knee pain worse. This may require you to change your exercise routines, sport participation, or job duties. °Wear shoes with cushioned soles. °Avoid sports that require running and sudden changes in direction. °Do physical therapy. Physical therapy is planned to match your needs and abilities. It may include exercises for strength, flexibility, stability, and endurance. °Do exercises that increase balance and strength, such as tai chi and yoga. °Do not use the injured limb to support your body weight   until your health care provider says that you can. Use crutches as told by your health care provider. °Return to your normal activities as told by your health care provider. Ask your health care provider what activities are safe for you. °General instructions °Take over-the-counter and prescription medicines only as told by your health care provider. °Lose weight if you are overweight. Losing even a  little weight can reduce knee pain. Ask your health care provider what your ideal weight is, and how to safely lose extra weight. A dietitian may be able to help you plan your meals. °Do not use any products that contain nicotine or tobacco, such as cigarettes, e-cigarettes, and chewing tobacco. These can delay healing. If you need help quitting, ask your health care provider. °Keep all follow-up visits. This is important. °Contact a health care provider if: °You have knee pain that is not getting better or gets worse. °You are unable to do your physical therapy exercises due to knee pain. °Get help right away if: °Your knee swells and the swelling becomes worse. °You cannot move your knee. °You have severe knee pain. °Summary °Knee pain that lasts more than 3 months is considered chronic knee pain. °The main treatments for chronic knee pain are physical therapy and weight loss. You may also need to take medicines, wear a knee sleeve or brace, use crutches, and apply ice or heat. °Losing even a little weight can reduce knee pain. Ask your health care provider what your ideal weight is, and how to safely lose extra weight. A dietitian may be able to help you plan your meals. °Follow the exercise plan that your health care provider designed for you. °This information is not intended to replace advice given to you by your health care provider. Make sure you discuss any questions you have with your health care provider. °Document Revised: 11/16/2019 Document Reviewed: 11/16/2019 °Elsevier Patient Education © 2022 [employer] ° °

## 2021-04-09 LAB — RPR: RPR Ser Ql: NONREACTIVE

## 2021-04-09 LAB — HEPATITIS B SURFACE ANTIGEN: Hepatitis B Surface Ag: NONREACTIVE

## 2021-04-09 LAB — HIV ANTIBODY (ROUTINE TESTING W REFLEX): HIV 1&2 Ab, 4th Generation: NONREACTIVE

## 2021-04-11 LAB — CHLAMYDIA/GC NAA, CONFIRMATION
Chlamydia trachomatis, NAA: NEGATIVE
Neisseria gonorrhoeae, NAA: NEGATIVE

## 2021-04-15 ENCOUNTER — Other Ambulatory Visit: Payer: Self-pay

## 2021-04-15 ENCOUNTER — Ambulatory Visit (INDEPENDENT_AMBULATORY_CARE_PROVIDER_SITE_OTHER): Payer: BC Managed Care – PPO | Admitting: Orthopedic Surgery

## 2021-04-15 DIAGNOSIS — M87059 Idiopathic aseptic necrosis of unspecified femur: Secondary | ICD-10-CM

## 2021-04-15 DIAGNOSIS — M162 Bilateral osteoarthritis resulting from hip dysplasia: Secondary | ICD-10-CM

## 2021-04-16 ENCOUNTER — Encounter: Payer: Self-pay | Admitting: Orthopedic Surgery

## 2021-04-16 NOTE — Progress Notes (Signed)
Patient is seen in follow-up for avascular necrosis and collapse of both hips.  I referred patient for evaluation to Dr. Magnus Ivan.  Dr. Magnus Ivan felt that the surgical intervention would require a specialist and a referral consultation was placed with Dr. Doristine Counter.  Patient has not heard from Dr. Santa Genera office, we will replace consult.

## 2021-05-14 DIAGNOSIS — M25551 Pain in right hip: Secondary | ICD-10-CM | POA: Diagnosis not present

## 2021-05-14 DIAGNOSIS — M25552 Pain in left hip: Secondary | ICD-10-CM | POA: Diagnosis not present

## 2021-07-16 DIAGNOSIS — Q6589 Other specified congenital deformities of hip: Secondary | ICD-10-CM | POA: Diagnosis not present

## 2021-08-13 ENCOUNTER — Other Ambulatory Visit: Payer: Self-pay

## 2021-08-14 ENCOUNTER — Encounter: Payer: Self-pay | Admitting: Family Medicine

## 2021-08-14 ENCOUNTER — Ambulatory Visit (INDEPENDENT_AMBULATORY_CARE_PROVIDER_SITE_OTHER): Payer: BC Managed Care – PPO | Admitting: Family Medicine

## 2021-08-14 VITALS — BP 116/70 | HR 69 | Temp 97.6°F | Ht 64.0 in | Wt 133.8 lb

## 2021-08-14 DIAGNOSIS — L723 Sebaceous cyst: Secondary | ICD-10-CM | POA: Diagnosis not present

## 2021-08-14 DIAGNOSIS — M5412 Radiculopathy, cervical region: Secondary | ICD-10-CM

## 2021-08-14 DIAGNOSIS — Z113 Encounter for screening for infections with a predominantly sexual mode of transmission: Secondary | ICD-10-CM

## 2021-08-14 MED ORDER — NAPROXEN 500 MG PO TABS: 500.0000 mg | ORAL_TABLET | Freq: Two times a day (BID) | ORAL | 0 refills | Status: DC

## 2021-08-14 MED ORDER — CYCLOBENZAPRINE HCL 10 MG PO TABS: 10.0000 mg | ORAL_TABLET | Freq: Three times a day (TID) | ORAL | 0 refills | Status: AC | PRN

## 2021-08-14 NOTE — Patient Instructions (Signed)
Use heat on shoulder in the evenings. ?Contact by My Chart if symptoms not improving in 1 week. ?

## 2021-08-14 NOTE — Progress Notes (Signed)
?[facility] ?[facility] ?[address] ?[place] [address] ?Dept: [phone] ?Dept Fax: [phone] ? ?Office Visit ? ?Subjective:  ? ? Patient ID: Tony Briggs, male    DOB: 06/21/1980, 41 y.o..   MRN: [id number] ? ?Chief Complaint  ?Patient presents with  ? Acute Visit  ?  C/o having LT side neck/sholder pain with numbness in fingers x 1 week.  He has taken a muscle relaxer and Ibuprofen with little relief.  ? ? ?History of Present Illness: ? ?Patient is in today for evaluation of a 1-week history of left neck pain and numbness in the left 1st and 2nd finger. Mr. Mederos is very active on his job, but does not recall any specific event that may have set this off. He did take 400 mg of ibuprofen daily for 2 days, but did not note any improvement of his symptoms. He has not noted any muscles weakness. He did have some stiffness int he left neck, but this is improving.  ? ?Mr. Cogburn notes that he has two regular sexual partners, one of which he uses condoms with. He notes one of the partners recently had a Trichomonas infection. He wonders about whether he needs treatment for this. ? ?Past Medical History: ?Patient Active Problem List  ? Diagnosis Date Noted  ? Arthritis of left knee 04/08/2021  ? Congenital dysplasia of both hips 08/30/2020  ? Borderline hyperlipidemia 07/16/2020  ? ?Past Surgical History:  ?Procedure Laterality Date  ? KNEE SURGERY Right   ? ?Family History  ?Problem Relation Age of Onset  ? Healthy Mother   ? ?No outpatient medications prior to visit.  ? ?No facility-administered medications prior to visit.  ? ?No Known Allergies ?   ?Objective:  ? ?Today's Vitals  ? 08/14/21 0901  ?BP: 116/70  ?Pulse: 69  ?Temp: 97.6 ?F (36.4 ?C)  ?TempSrc: Temporal  ?SpO2: 96%  ?Weight: 133 lb 12.8 oz (60.7 kg)  ?Height: 5\' 4"  (1.626 m)  ? ?Body mass index is 22.97 kg/m?.  ? ?General: Well developed, well nourished. No acute distress. ?Neck: Supple. No pain  over neck, but present down across the upper shoulder (trapezius). There is a 1 cm  ? subdermal nodule c/w a cyst present over the left shoulder ridge. ?Extremities: ROM restricted in the left glenohumeral joint. Strength is 5/5 throughout arm and hand. No joint  ? swelling or tenderness. Neuro: Decreased sensation in left 1st and 2nd fingers. ?Psych: Alert and oriented. Normal mood and affect. ? ?Health Maintenance Due  ?Topic Date Due  ? TETANUS/TDAP  Never done  ?   ?Assessment & Plan:  ? ?1. C6 radiculopathy ?I recommend we try a 7-day course of naproxen bid. Mr. Santibanez should use heat over the shoulder at night. I will add a muscle relaxer for sleep primarily. If not improving with conservative therapy, I will try a course of prednisone. ? ?- naproxen (NAPROSYN) 500 MG tablet; Take 1 tablet (500 mg total) by mouth 2 (two) times daily with a meal.  Dispense: 14 tablet; Refill: 0 ?- cyclobenzaprine (FLEXERIL) 10 MG tablet; Take 1 tablet (10 mg total) by mouth 3 (three) times daily as needed for muscle spasms.  Dispense: 21 tablet; Refill: 0 ? ?2. Screen for STD (sexually transmitted disease) ?Discussed screening for STDs. Mr. Peavy does not have signs of urethritis, so no need to treat for Trichomonas exposure. ? ?- HCV Ab w Reflex to Quant PCR ?- Chlamydia/GC NAA, Confirmation ?- HIV  Antibody (routine testing w rflx) ?- RPR ? ?3. Sebaceous cyst, left shoulder ?Discussed elective cyst removal should he want to pursue this. ? ?No follow-ups on file.  ? ?Loyola Mast, MD ?

## 2021-08-15 LAB — RPR: RPR Ser Ql: NONREACTIVE

## 2021-08-15 LAB — HCV AB W REFLEX TO QUANT PCR: HCV Ab: NONREACTIVE

## 2021-08-15 LAB — HCV INTERPRETATION

## 2021-08-15 LAB — HIV ANTIBODY (ROUTINE TESTING W REFLEX): HIV 1&2 Ab, 4th Generation: NONREACTIVE

## 2021-08-16 LAB — CHLAMYDIA/GC NAA, CONFIRMATION
Chlamydia trachomatis, NAA: NEGATIVE
Neisseria gonorrhoeae, NAA: NEGATIVE
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
## 2022-03-08 ENCOUNTER — Other Ambulatory Visit: Payer: Self-pay

## 2022-03-08 ENCOUNTER — Encounter (HOSPITAL_COMMUNITY): Payer: Self-pay | Admitting: Emergency Medicine

## 2022-03-08 ENCOUNTER — Emergency Department (HOSPITAL_COMMUNITY): Payer: BC Managed Care – PPO

## 2022-03-08 ENCOUNTER — Emergency Department (HOSPITAL_COMMUNITY)
Admission: EM | Admit: 2022-03-08 | Discharge: 2022-03-08 | Disposition: A | Discharge status: Home / Self Care | Payer: BC Managed Care – PPO | Attending: Emergency Medicine | Admitting: Emergency Medicine

## 2022-03-08 DIAGNOSIS — R0789 Other chest pain: Secondary | ICD-10-CM | POA: Diagnosis not present

## 2022-03-08 DIAGNOSIS — R079 Chest pain, unspecified: Secondary | ICD-10-CM | POA: Diagnosis not present

## 2022-03-08 DIAGNOSIS — R0602 Shortness of breath: Secondary | ICD-10-CM | POA: Insufficient documentation

## 2022-03-08 DIAGNOSIS — R0600 Dyspnea, unspecified: Secondary | ICD-10-CM | POA: Diagnosis not present

## 2022-03-08 LAB — BASIC METABOLIC PANEL
Anion gap: 7 (ref 5–15)
BUN: 15 mg/dL (ref 6–20)
CO2: 25 mmol/L (ref 22–32)
Calcium: 8.9 mg/dL (ref 8.9–10.3)
Chloride: 103 mmol/L (ref 98–111)
Creatinine, Ser: 1.38 mg/dL — ABNORMAL HIGH (ref 0.61–1.24)
GFR, Estimated: >60 mL/min (ref >60)
Glucose, Bld: 86 mg/dL (ref 70–99)
Potassium: 3.2 mmol/L — ABNORMAL LOW (ref 3.5–5.1)
Sodium: 135 mmol/L (ref 135–145)

## 2022-03-08 LAB — CBC
HCT: 42.1 % (ref 39.0–52.0)
Hemoglobin: 14.1 g/dL (ref 13.0–17.0)
MCH: 29.4 pg (ref 26.0–34.0)
MCHC: 33.5 g/dL (ref 30.0–36.0)
MCV: 87.9 fL (ref 80.0–100.0)
Platelets: 287 10*3/uL (ref 150–400)
RBC: 4.79 MIL/uL (ref 4.22–5.81)
RDW: 13.2 % (ref 11.5–15.5)
WBC: 6.5 10*3/uL (ref 4.0–10.5)
nRBC: 0 % (ref 0.0–0.2)

## 2022-03-08 LAB — TROPONIN I (HIGH SENSITIVITY)
Troponin I (High Sensitivity): 3 ng/L (ref <18)
Troponin I (High Sensitivity): 4 ng/L (ref <18)

## 2022-03-08 MED ORDER — POTASSIUM CHLORIDE 20 MEQ PO PACK
40.0000 meq | PACK | Freq: Once | ORAL | Status: DC
  Filled 2022-03-08: qty 2

## 2022-03-08 MED ORDER — NAPROXEN 250 MG PO TABS
500.0000 mg | ORAL_TABLET | Freq: Once | ORAL | Status: DC
  Filled 2022-03-08: qty 2

## 2022-03-08 MED ORDER — NAPROXEN 375 MG PO TABS: 375.0000 mg | ORAL_TABLET | Freq: Two times a day (BID) | ORAL | 0 refills | Status: AC

## 2022-03-08 NOTE — ED Provider Notes (Signed)
[facility] EMERGENCY DEPARTMENT Provider Note   CSN: [id number] Arrival date & time: 03/08/22  0224     History  Chief Complaint  Patient presents with   Chest Pain    Tony Briggs is a 41 y.o. male.  41 y.o male with no PMH presents to the ED with a chief complaint of chest pain x 4 days. 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  He also reports feeling somewhat short of breath, and states that usually when this occurs he is able to "stretch it out ".  This pain has been ongoing for the past couple of days.  Describes his pain about a 7 out of 10.  He reports it feels like "acid reflux", however he does not have a history of GERD.  He does not do any heavy lifting at work, however he describes this as if he "had been working out for a while ".  Has not taken any medication for improvement in his symptoms.  He recently took up marijuana use, but denies any tobacco use.  Does not have any prior history, No CAD. No prior hx of blood clots.   The history is provided by the patient.  Chest Pain Pain location:  L chest Pain quality: aching   Pain radiates to:  Does not radiate Pain severity:  Mild Onset quality:  Sudden Duration:  4 days Timing:  Intermittent Progression:  Unchanged Chronicity:  New Context: lifting and movement   Context: not drug use   Relieved by:  Nothing Worsened by:  Nothing Ineffective treatments:  None tried Associated symptoms: shortness of breath   Associated symptoms: no abdominal pain, no back pain, no fever, no headache, no heartburn, no nausea and no vomiting   Risk factors: male sex and smoking   Risk factors: no diabetes mellitus and no prior DVT/PE        Home Medications Prior to Admission medications   Medication Sig Start Date End Date Taking?  Authorizing Provider  naproxen (NAPROSYN) 375 MG tablet Take 1 tablet (375 mg total) by mouth 2 (two) times daily with a meal for 7 days. 03/08/22 03/15/22 Yes Yaacov Koziol, Beverley Fiedler, PA-C  cyclobenzaprine (FLEXERIL) 10 MG tablet Take 1 tablet (10 mg total) by mouth 3 (three) times daily as needed for muscle spasms. 08/14/21   Haydee Salter, MD      Allergies    Patient has no known allergies.    Review of Systems   Review of Systems  Constitutional:  Negative for fever.  Respiratory:  Positive for shortness of breath.   Cardiovascular:  Positive for chest pain.  Gastrointestinal:  Negative for abdominal pain, heartburn, nausea and vomiting.  Genitourinary:  Negative for flank pain.  Musculoskeletal:  Negative for back pain.  Neurological:  Negative for light-headedness and headaches.  All other systems reviewed and are negative.   Physical Exam Updated Vital Signs BP 124/88   Pulse 69   Temp 98.2 F (36.8 C) (Oral)   Resp 16   SpO2 97%  Physical Exam Vitals and nursing note reviewed.  Constitutional:      Appearance: He is well-developed.  HENT:     Head: Normocephalic and atraumatic.  Eyes:     General: No scleral icterus.    Pupils: Pupils are equal, round, and  reactive to light.  Cardiovascular:     Heart sounds: Normal heart sounds.  Pulmonary:     Effort: Pulmonary effort is normal.     Breath sounds: Normal breath sounds. No wheezing.  Chest:     Chest wall: Tenderness present.    Abdominal:     General: Bowel sounds are normal. There is no distension.     Palpations: Abdomen is soft.     Tenderness: There is no abdominal tenderness.  Musculoskeletal:        General: No tenderness or deformity.     Cervical back: Normal range of motion.  Skin:    General: Skin is warm and dry.  Neurological:     Mental Status: He is alert and oriented to person, place, and time.     ED Results / Procedures / Treatments   Labs (all labs ordered are listed, but only abnormal  results are displayed) Labs Reviewed  BASIC METABOLIC PANEL - Abnormal; Notable for the following components:      Result Value   Potassium 3.2 (*)    Creatinine, Ser 1.38 (*)    All other components within normal limits  CBC  TROPONIN I (HIGH SENSITIVITY)  TROPONIN I (HIGH SENSITIVITY)    EKG None  Radiology DG Chest 2 View  Result Date: 03/08/2022 CLINICAL DATA:  Intermittent left chest pain and dyspnea EXAM: CHEST - 2 VIEW COMPARISON:  None Available. FINDINGS: No focal consolidation, pleural effusion, or pneumothorax. Normal cardiomediastinal silhouette. No acute osseous abnormality. IMPRESSION: No active cardiopulmonary disease. Electronically Signed   By: Placido Sou M.D.   On: 03/08/2022 03:11    Procedures Procedures    Medications Ordered in ED Medications  potassium chloride (KLOR-CON) packet 40 mEq (has no administration in time range)  naproxen (NAPROSYN) tablet 500 mg (has no administration in time range)    ED Course/ Medical Decision Making/ A&P                           Medical Decision Making Amount and/or Complexity of Data Reviewed Labs: ordered. Radiology: ordered.   This patient presents to the ED for concern of chest pain, this involves a number of treatment options, and is a complaint that carries with it a high risk of complications and morbidity.  The differential diagnosis includes ACS, pulmonary embolism, pneumonia versus MSK component.    Co morbidities: Discussed in HPI   Brief History:  Patient here with no cardiac history   EMR reviewed including pt PMHx, past surgical history and past visits to ER.   See HPI for more details   Lab Tests:  I ordered and independently interpreted labs.  The pertinent results include:    I personally reviewed all laboratory work and imaging. Metabolic panel without any acute abnormality specifically kidney function within normal limits and no significant electrolyte abnormalities. CBC without  leukocytosis or significant anemia.   Imaging Studies:  NAD. I personally reviewed all imaging studies and no acute abnormality found. I agree with radiology interpretation.    Cardiac Monitoring:  The patient was maintained on a cardiac monitor.  I personally viewed and interpreted the cardiac monitored which showed an underlying rhythm of: NSR 74 EKG non-ischemic   Medicines ordered:  I ordered medication including potassium  for mild hypokalemia Reevaluation of the patient after these medicines showed that the patient improved I have reviewed the patients home medicines and have made adjustments as needed Given naproxen  to help with pain.   Reevaluation:  After the interventions noted above I re-evaluated patient and found that they have :improved   Social Determinants of Health:  The patient's social determinants of health were a factor in the care of this patient    Problem List / ED Course:  Patient here with intermittent left-sided chest pain which began approximately 4 days ago, however episode occurred yesterday he was standing at work.  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  Pain is described to be an aching sensation to the left chest somewhat improved if he "stretches".  He reports not working out recently, does not feel like he strained himself too much at work.  During evaluation pain is reproducible with palpation specially with ranging of the left arm.  Labs were reviewed without any acute findings, no electrolyte derangement, creatine slightly elevated but within his baseline.  I did discuss with him increasing fluids at home.  X-ray without any pneumonia, no pleural effusion.  He is hemodynamically stable.  No hypoxia, no tachycardia, no prior history of blood clots I am have a low suspicion for pulmonary embolism.  Sitter MSK component due to pain be reproducible with palpation along with ranging of the left shoulder.  Discussed with him  treatment with anti-inflammatories.  We will also give him a replacement of potassium via oral.  He agrees with plan and treatment at this time.  Patient is stable for discharge. Heart score of 0.   Dispostion:  After consideration of the diagnostic results and the patients response to treatment, I feel that the patent would benefit from continued treatment with anti-inflammatories to help with symptomatic treatment.    Portions of this note were generated with [employer]. Dictation errors may occur despite best attempts at proofreading.  Final Clinical Impression(s) / ED Diagnoses Final diagnoses:  Atypical chest pain    Rx / DC Orders ED Discharge Orders          Ordered    naproxen (NAPROSYN) 375 MG tablet  2 times daily with meals        03/08/22 0736              Janeece Fitting, PA-C 03/08/22 0740    Cristie Hem, MD 03/08/22 1159

## 2022-03-08 NOTE — Discharge Instructions (Addendum)
Your laboratories also are within normal limits today.  I did prescribe an anti-inflammatory to help with your pain, please take 1 tablet twice a day with food for the next 7 days.  If you experience any shortness of breath, worsening symptoms you will need to return to the emergency department.

## 2022-03-08 NOTE — ED Triage Notes (Signed)
Patient reports intermittent left chest pain with SOB onset last week , no emesis or diaphoresis , denies cough or fever .
